# Patient Record
Sex: Female | Born: 1950 | Race: White | Hispanic: No | Marital: Married | Smoking: Never smoker
Health system: Southern US, Community
[De-identification: ages and names within clinical notes are randomized; demographics above are authoritative.]

## PROBLEM LIST (undated history)

## (undated) DIAGNOSIS — R079 Chest pain, unspecified: Secondary | ICD-10-CM

## (undated) DIAGNOSIS — N399 Disorder of urinary system, unspecified: Secondary | ICD-10-CM

## (undated) DIAGNOSIS — N3281 Overactive bladder: Secondary | ICD-10-CM

## (undated) DIAGNOSIS — M25512 Pain in left shoulder: Secondary | ICD-10-CM

## (undated) DIAGNOSIS — E782 Mixed hyperlipidemia: Secondary | ICD-10-CM

## (undated) DIAGNOSIS — M19019 Primary osteoarthritis, unspecified shoulder: Secondary | ICD-10-CM

## (undated) DIAGNOSIS — Z780 Asymptomatic menopausal state: Secondary | ICD-10-CM

## (undated) DIAGNOSIS — IMO0002 Reserved for concepts with insufficient information to code with codable children: Secondary | ICD-10-CM

## (undated) DIAGNOSIS — M722 Plantar fascial fibromatosis: Secondary | ICD-10-CM

## (undated) HISTORY — DX: Plantar fascial fibromatosis: M72.2

## (undated) HISTORY — DX: Chest pain, unspecified: R07.9

## (undated) HISTORY — DX: Asymptomatic menopausal state: Z78.0

## (undated) HISTORY — DX: Disorder of urinary system, unspecified: N39.9

## (undated) HISTORY — DX: Primary osteoarthritis, unspecified shoulder: M19.019

## (undated) HISTORY — DX: Pain in left shoulder: M25.512

## (undated) HISTORY — DX: Mixed hyperlipidemia: E78.2

## (undated) HISTORY — DX: Reserved for concepts with insufficient information to code with codable children: IMO0002

## (undated) HISTORY — DX: Overactive bladder: N32.81

---

## 2001-01-21 ENCOUNTER — Encounter: Admission: RE | Admit: 2001-01-21 | Discharge: 2001-01-21 | Payer: Self-pay | Admitting: Occupational Medicine

## 2001-01-21 ENCOUNTER — Encounter: Payer: Self-pay | Admitting: Occupational Medicine

## 2001-07-16 ENCOUNTER — Encounter: Admission: RE | Admit: 2001-07-16 | Discharge: 2001-08-12 | Payer: Self-pay | Admitting: Family Medicine

## 2001-08-18 ENCOUNTER — Encounter: Payer: Self-pay | Admitting: Family Medicine

## 2001-08-18 ENCOUNTER — Ambulatory Visit (HOSPITAL_COMMUNITY): Admission: RE | Admit: 2001-08-18 | Discharge: 2001-08-18 | Payer: Self-pay | Admitting: Family Medicine

## 2001-12-29 ENCOUNTER — Encounter: Admission: RE | Admit: 2001-12-29 | Discharge: 2002-01-20 | Payer: Self-pay | Admitting: *Deleted

## 2002-01-26 ENCOUNTER — Encounter: Admission: RE | Admit: 2002-01-26 | Discharge: 2002-03-03 | Payer: Self-pay | Admitting: Occupational Medicine

## 2003-04-26 ENCOUNTER — Other Ambulatory Visit: Admission: RE | Admit: 2003-04-26 | Discharge: 2003-04-26 | Payer: Self-pay | Admitting: Obstetrics and Gynecology

## 2016-04-07 ENCOUNTER — Emergency Department (HOSPITAL_COMMUNITY)
Admission: EM | Admit: 2016-04-07 | Discharge: 2016-04-08 | Disposition: A | Discharge status: Home / Self Care | Payer: Medicare Other | Attending: Emergency Medicine | Admitting: Emergency Medicine

## 2016-04-07 DIAGNOSIS — R600 Localized edema: Secondary | ICD-10-CM | POA: Insufficient documentation

## 2016-04-07 DIAGNOSIS — R079 Chest pain, unspecified: Secondary | ICD-10-CM

## 2016-04-08 ENCOUNTER — Encounter (HOSPITAL_COMMUNITY): Payer: Self-pay | Admitting: Emergency Medicine

## 2016-04-08 ENCOUNTER — Emergency Department (HOSPITAL_COMMUNITY): Payer: Medicare Other

## 2016-04-08 LAB — I-STAT TROPONIN, ED
TROPONIN I, POC: 0 ng/mL (ref 0.00–0.08)
TROPONIN I, POC: 0 ng/mL (ref 0.00–0.08)

## 2016-04-08 LAB — CBC
HCT: 42.9 % (ref 36.0–46.0)
Hemoglobin: 13.7 g/dL (ref 12.0–15.0)
MCH: 27.6 pg (ref 26.0–34.0)
MCHC: 31.9 g/dL (ref 30.0–36.0)
MCV: 86.5 fL (ref 78.0–100.0)
PLATELETS: 281 10*3/uL (ref 150–400)
RBC: 4.96 MIL/uL (ref 3.87–5.11)
RDW: 14.5 % (ref 11.5–15.5)
WBC: 12.7 10*3/uL — ABNORMAL HIGH (ref 4.0–10.5)

## 2016-04-08 LAB — BASIC METABOLIC PANEL
Anion gap: 7 (ref 5–15)
BUN: 13 mg/dL (ref 6–20)
CALCIUM: 9.5 mg/dL (ref 8.9–10.3)
CHLORIDE: 102 mmol/L (ref 101–111)
CO2: 28 mmol/L (ref 22–32)
CREATININE: 0.82 mg/dL (ref 0.44–1.00)
GFR calc Af Amer: >60 mL/min (ref >60)
GFR calc non Af Amer: >60 mL/min (ref >60)
GLUCOSE: 105 mg/dL — AB (ref 65–99)
Potassium: 3.9 mmol/L (ref 3.5–5.1)
Sodium: 137 mmol/L (ref 135–145)

## 2016-04-08 LAB — BRAIN NATRIURETIC PEPTIDE: B Natriuretic Peptide: 25 pg/mL (ref 0.0–100.0)

## 2016-04-08 NOTE — ED Notes (Signed)
EDP at bedside  

## 2016-04-08 NOTE — Discharge Instructions (Signed)
Nonspecific Chest Pain  °Chest pain can be caused by many different conditions. There is always a chance that your pain could be related to something serious, such as a heart attack or a blood clot in your lungs. Chest pain can also be caused by conditions that are not life-threatening. If you have chest pain, it is very important to follow up with your health care provider. °CAUSES  °Chest pain can be caused by: °· Heartburn. °· Pneumonia or bronchitis. °· Anxiety or stress. °· Inflammation around your heart (pericarditis) or lung (pleuritis or pleurisy). °· A blood clot in your lung. °· A collapsed lung (pneumothorax). It can develop suddenly on its own (spontaneous pneumothorax) or from trauma to the chest. °· Shingles infection (varicella-zoster virus). °· Heart attack. °· Damage to the bones, muscles, and cartilage that make up your chest wall. This can include: °¨ Bruised bones due to injury. °¨ Strained muscles or cartilage due to frequent or repeated coughing or overwork. °¨ Fracture to one or more ribs. °¨ Sore cartilage due to inflammation (costochondritis). °RISK FACTORS  °Risk factors for chest pain may include: °· Activities that increase your risk for trauma or injury to your chest. °· Respiratory infections or conditions that cause frequent coughing. °· Medical conditions or overeating that can cause heartburn. °· Heart disease or family history of heart disease. °· Conditions or health behaviors that increase your risk of developing a blood clot. °· Having had chicken pox (varicella zoster). °SIGNS AND SYMPTOMS °Chest pain can feel like: °· Burning or tingling on the surface of your chest or deep in your chest. °· Crushing, pressure, aching, or squeezing pain. °· Dull or sharp pain that is worse when you move, cough, or take a deep breath. °· Pain that is also felt in your back, neck, shoulder, or arm, or pain that spreads to any of these areas. °Your chest pain may come and go, or it may stay  constant. °DIAGNOSIS °Lab tests or other studies may be needed to find the cause of your pain. Your health care provider may have you take a test called an ambulatory ECG (electrocardiogram). An ECG records your heartbeat patterns at the time the test is performed. You may also have other tests, such as: °· Transthoracic echocardiogram (TTE). During echocardiography, sound waves are used to create a picture of all of the heart structures and to look at how blood flows through your heart. °· Transesophageal echocardiogram (TEE). This is a more advanced imaging test that obtains images from inside your body. It allows your health care provider to see your heart in finer detail. °· Cardiac monitoring. This allows your health care provider to monitor your heart rate and rhythm in real time. °· Holter monitor. This is a portable device that records your heartbeat and can help to diagnose abnormal heartbeats. It allows your health care provider to track your heart activity for several days, if needed. °· Stress tests. These can be done through exercise or by taking medicine that makes your heart beat more quickly. °· Blood tests. °· Imaging tests. °TREATMENT  °Your treatment depends on what is causing your chest pain. Treatment may include: °· Medicines. These may include: °¨ Acid blockers for heartburn. °¨ Anti-inflammatory medicine. °¨ Pain medicine for inflammatory conditions. °¨ Antibiotic medicine, if an infection is present. °¨ Medicines to dissolve blood clots. °¨ Medicines to treat coronary artery disease. °· Supportive care for conditions that do not require medicines. This may include: °¨ Resting. °¨ Applying heat   or cold packs to injured areas. °¨ Limiting activities until pain decreases. °HOME CARE INSTRUCTIONS °· If you were prescribed an antibiotic medicine, finish it all even if you start to feel better. °· Avoid any activities that bring on chest pain. °· Do not use any tobacco products, including  cigarettes, chewing tobacco, or electronic cigarettes. If you need help quitting, ask your health care provider. °· Do not drink alcohol. °· Take medicines only as directed by your health care provider. °· Keep all follow-up visits as directed by your health care provider. This is important. This includes any further testing if your chest pain does not go away. °· If heartburn is the cause for your chest pain, you may be told to keep your head raised (elevated) while sleeping. This reduces the chance that acid will go from your stomach into your esophagus. °· Make lifestyle changes as directed by your health care provider. These may include: °¨ Getting regular exercise. Ask your health care provider to suggest some activities that are safe for you. °¨ Eating a heart-healthy diet. A registered dietitian can help you to learn healthy eating options. °¨ Maintaining a healthy weight. °¨ Managing diabetes, if necessary. °¨ Reducing stress. °SEEK MEDICAL CARE IF: °· Your chest pain does not go away after treatment. °· You have a rash with blisters on your chest. °· You have a fever. °SEEK IMMEDIATE MEDICAL CARE IF:  °· Your chest pain is worse. °· You have an increasing cough, or you cough up blood. °· You have severe abdominal pain. °· You have severe weakness. °· You faint. °· You have chills. °· You have sudden, unexplained chest discomfort. °· You have sudden, unexplained discomfort in your arms, back, neck, or jaw. °· You have shortness of breath at any time. °· You suddenly start to sweat, or your skin gets clammy. °· You feel nauseous or you vomit. °· You suddenly feel light-headed or dizzy. °· Your heart begins to beat quickly, or it feels like it is skipping beats. °These symptoms may represent a serious problem that is an emergency. Do not wait to see if the symptoms will go away. Get medical help right away. Call your local emergency services (911 in the [place] Do not drive yourself to the hospital. °  °This  information is not intended to replace advice given to you by your health care provider. Make sure you discuss any questions you have with your health care provider. °  °Document Released: 08/14/2005 Document Revised: 11/25/2014 Document Reviewed: 06/10/2014 °Elsevier Interactive Patient Education ©2016 [employer]vier Inc[employer] ° ° °To find a primary care or specialty doctor please call [phone] or [phone] to access "[facility] Find a Doctor Service." ° °You may also go on the [facility] website at [email or web address] ° °There are also multiple [facility]  and [facility] practices throughout the [place] that are frequently accepting new patients. You may find a clinic that is close to your home and contact them. ° °[facility] -  °[address] °[place] [address] °[phone] ° °[facility] in [place] (also locations in [place] and [place]) -  °[address] °[place] [address] °[phone] ° °[facility] -  °[address] °[place] [address] °[phone] ° ° ° °

## 2016-04-08 NOTE — ED Provider Notes (Signed)
By signing my name below, I, Betty Dickerson, attest that this documentation has been prepared under the direction and in the presence of Betty Thal N Gionni Freese, DO. Electronically Signed: Doreatha MartinEva Dickerson, ED Scribe. 04/08/2016. 12:18 AM.   TIME SEEN: 12:10 AM   CHIEF COMPLAINT:  Chief Complaint  Patient presents with  . Chest Pain     HPI:  HPI Comments: Betty Dickerson is a 65 y.o. female with no significant past medical history brought in by ambulance who presents to the Emergency Department complaining of moderate, intermittent, non-radiating, substernal 3/10 chest tightness onset at 9:30PM last night while watching TV. Per pt, her symptoms have currently resolved. She reports that she ambulated, lied on her back and on her side with no relief before finding relief on the way to the ED. She states her pain was unaffected by movement or exertion. No other worsening factors noted. She was administered 2 NTG en route with complete relief of symptoms. She also took five 81 mg ASA PTA. Pt reports an episode of similar symptoms last year and had negative work up and stress test at that time. Pt is a non-smoker. No h/o heart disease, DM, hypertension, hyperlipidemia, tobacco use. She has no family history of CAD. She denies recent fever, cough, SOB, nausea, emesis, dizziness, diaphoresis. Pt sees a PA for primary care in [place] and has recently moved. Had similar symptoms one year ago was [facility] and had a stress test that she reports is negative.  ROS: See HPI Constitutional: no fever  Eyes: no drainage  ENT: no runny nose   Cardiovascular: Positive chest pain/tigthness (resolved)  Resp: no SOB  GI: no vomiting GU: no dysuria Integumentary: no rash  Allergy: no hives  Musculoskeletal: no leg swelling  Neurological: no slurred speech ROS otherwise negative  PAST MEDICAL HISTORY/PAST SURGICAL HISTORY:  History reviewed. No pertinent past medical history.  MEDICATIONS:   Prior to Admission medications   Not on File    ALLERGIES:  No Known Allergies  SOCIAL HISTORY:  Social History  Substance Use Topics  . Smoking status: Never Smoker   . Smokeless tobacco: Not on file  . Alcohol Use: Yes     Comment: occassional    FAMILY HISTORY: No family history on file.  EXAM: BP 137/87 mmHg  Pulse 80  Temp(Src) 98.8 F (37.1 C) (Oral)  Resp 19  SpO2 99% CONSTITUTIONAL: Alert and oriented and responds appropriately to questions. Well-appearing; well-nourished HEAD: Normocephalic EYES: Conjunctivae clear, PERRL ENT: normal nose; no rhinorrhea; moist mucous membranes NECK: Supple, no meningismus, no LAD  CARD: RRR; S1 and S2 appreciated; no murmurs, no clicks, no rubs, no gallops RESP: Normal chest excursion without splinting or tachypnea; breath sounds clear and equal bilaterally; no wheezes, no rhonchi, no rales, no hypoxia or respiratory distress, speaking full sentences ABD/GI: Normal bowel sounds; non-distended; soft, non-tender, no rebound, no guarding, no peritoneal signs BACK:  The back appears normal and is non-tender to palpation, there is no CVA tenderness EXT: Normal ROM in all joints; non-tender to palpation; Mild bilateral trace pitting edema in her feet and ankles that she reports is chronic and unchanged; normal capillary refill; no cyanosis, no calf tenderness or swelling    SKIN: Normal color for age and race; warm; no rash NEURO: Moves all extremities equally, sensation to light touch intact diffusely, cranial nerves II through XII intact PSYCH: The patient's mood and manner are appropriate. Grooming and personal hygiene are appropriate.  MEDICAL DECISION  MAKING: Patient here with complaints of chest tightness that resolved with nitroglycerin. No other associated symptoms. EKG shows no ischemic abnormality. We'll obtain cardiac labs, chest x-ray. Patient's heart score is 2 due to age. I feel patient can likely have serial troponins in  the emergency department and discharge if she continues to stay asymptomatic. We'll attempt to obtain records from outside hospital for stress test that she reports was 1 year ago. Doubt PE, dissection given asymptomatic currently.  ED PROGRESS: Patient has had 2 negative troponins. Her chest x-ray showed possible edema but she does not appear fluid overloaded. No shortness of breath, hypoxia. Lungs are clear and no new edema in her lower extremities. She does have some edema in her legs that she states she has had since she delivered her last child over 20 years ago. No change in the swelling and no calf tenderness. Her BNP is normal. I have low suspicion that this is actual edema. No infectious symptoms. She still reports feeling pain-free. I think she can be discharged with close outpatient follow-up. She is comfortable with this plan. She has a PCP in [place]. Will also give her PCP information hearing [facility] as well as cardiology follow-up. I feel she will need an outpatient stress test. Discussed at length return precautions. She verbalizes understanding is comfortable with this plan.   At this time, I do not feel there is any life-threatening condition present. I have reviewed and discussed all results (EKG, imaging, lab, urine as appropriate), exam findings with patient. I have reviewed nursing notes and appropriate previous records.  I feel the patient is safe to be discharged home without further emergent workup. Discussed usual and customary return precautions. Patient and family (if present) verbalize understanding and are comfortable with this plan.  Patient will follow-up with their primary care provider. If they do not have a primary care provider, information for follow-up has been provided to them. All questions have been answered.     EKG Interpretation  Date/Time:  Monday Apr 08 2016 00:10:17 EDT Ventricular Rate:  67 PR Interval:  117 QRS Duration: 97 QT Interval:  422 QTC  Calculation: 445 R Axis:   113 Text Interpretation:  Sinus rhythm Borderline short PR interval Right axis deviation No old tracing to compare Confirmed by Lama Narayanan,  DO, Rishawn Walck ([id number]) on 04/08/2016 12:18:18 AM         I personally performed the services described in this documentation, which was scribed in my presence. The recorded information has been reviewed and is accurate.   Layla Maw Author [place], DO 04/08/16 [phone]

## 2016-04-08 NOTE — ED Notes (Signed)
Per EMS pt from home with c/o chest pressure 3/10. Pt took 5 81mg  aspirin prior to EMS arrival. Given 2 nitro en route, pain decreased to 1/10. BP-123/75, HR-80, CBG-113

## 2016-06-04 ENCOUNTER — Encounter: Payer: Self-pay | Admitting: *Deleted

## 2016-06-07 ENCOUNTER — Encounter: Payer: Self-pay | Admitting: Cardiovascular Disease

## 2016-06-07 ENCOUNTER — Telehealth: Payer: Self-pay | Admitting: Cardiovascular Disease

## 2016-06-07 NOTE — Telephone Encounter (Signed)
Called pt and left message for pt to call back to update Fm and medical Hx and to get correct information concerning work number.

## 2016-06-21 ENCOUNTER — Ambulatory Visit: Payer: Self-pay | Admitting: Cardiovascular Disease

## 2016-07-04 ENCOUNTER — Encounter: Payer: Self-pay | Admitting: Cardiovascular Disease

## 2016-07-26 NOTE — Progress Notes (Signed)
Cardiology Office Note   Date:  07/29/2016   ID:  Betty Dickerson, DOB 09/05/1951, MRN [id number]  PCP:  No primary care provider on file.  Cardiologist:   Charlton HawsPeter Dhaval Woo, MD   Chief Complaint  Patient presents with  . Establish Care    had cp, no more CP in several months, per pt      History of Present Illness: Betty Dickerson is a 65 y.o. female who presents for evaluation of chest pain  Seen in ER 04/08/16 Moderate intermittent non radiating substernal 3/10 chest tightness. Started while watching TV at night. No change with exertion or position Resolved spontaneously on way to ER ? Nitro on route improved Had similar symptoms 04/2015 [facility]  with normal stress test in [place] [facility]  and recently moved here.   Has unRx hypercholesterolemia. Scared to take statins Labs from 04/2016 reviewed  TC 264 LDL 156 HDL 54  In ER no pain two negative troponins and ECG normal CXR mild vascular congestion but none On clinical exam.  And BNP normal.  D dimer not done     Past Medical History:  Diagnosis Date  . AC (acromioclavicular) arthritis   . Chest pain   . Genitourinary disorder   . Mixed hyperlipidemia   . OAB (overactive bladder)   . Plantar fasciitis of left foot   . Post-menopausal   . Shoulder pain, left     Past Surgical History:  Procedure Laterality Date  . CESAREAN SECTION       Current Outpatient Prescriptions  Medication Sig Dispense Refill  . aspirin EC 81 MG tablet Take 81 mg by mouth daily.    . Biotin 300 MCG TABS Take 300 mcg by mouth daily.    Marland Kitchen. ketoconazole (NIZORAL) 2 % cream Apply 1 application topically 2 (two) times daily.    . Pumpkin Seed-Soy Germ (AZO BLADDER CONTROL/GO-LESS PO) Take 2 tablets by mouth 2 (two) times daily.     No current facility-administered medications for this visit.     Allergies:   Review of patient's allergies indicates no known allergies.    Social History:  The patient  reports  that she has never smoked. She has never used smokeless tobacco. She reports that she drinks alcohol.   Family History:  The patient's family history includes Diabetes in her father; Osteoarthritis in her mother.    ROS:  Please see the history of present illness.   Otherwise, review of systems are positive for none.   All other systems are reviewed and negative.    PHYSICAL EXAM: VS:  BP 114/72   Pulse 82   Ht 5' 5.5" (1.664 m)   Wt 91.2 kg (201 lb 1.9 oz)   SpO2 95%   BMI 32.96 kg/m  , BMI Body mass index is 32.96 kg/m. Affect appropriate Healthy:  appears stated age HEENT: normal Neck supple with no adenopathy JVP normal no bruits no thyromegaly Lungs clear with no wheezing and good diaphragmatic motion Heart:  S1/S2 no murmur, no rub, gallop or click PMI normal Abdomen: benighn, BS positve, no tenderness, no AAA no bruit.  No HSM or HJR Distal pulses intact with no bruits No edema Neuro non-focal Skin warm and dry No muscular weakness    EKG:   04/09/16 SR rate 67 S1Q3T3 nonspecific ST changes no ST elevation/depression    Recent Labs: 04/08/2016: B Natriuretic Peptide 25.0; BUN 13; Creatinine, Ser 0.82; Hemoglobin 13.7; Platelets 281; Potassium 3.9;  Sodium 137    Lipid Panel No results found for: CHOL, TRIG, HDL, CHOLHDL, VLDL, LDLCALC, LDLDIRECT    Wt Readings from Last 3 Encounters:  07/29/16 91.2 kg (201 lb 1.9 oz)      Other studies Reviewed: Additional studies/ records that were reviewed today include: ER notes, labs, CXR ECG Notes from [facility] and stress test 2016 .    ASSESSMENT AND PLAN:  1. Chest pain atypical recurrent f/u ETT with normal ECG also recommended calcium score to further Risk stratify 2. Chol:  Calcium score to see if should be Rx. If score is above average for age and sex matched Controls would recommend 81 mg ASA and statin at least 3x/week   Current medicines are reviewed at length with the patient today.  The  patient does not have concerns regarding medicines.  The following changes have been made:  no change  Labs/ tests ordered today include: Calcium Score/ ETT  Orders Placed This Encounter  Procedures  . CT CARDIAC SCORING  . EXERCISE TOLERANCE TEST     Disposition:   FU with me in a year      Signed, Charlton Haws, MD  07/29/2016 11:13 AM    [facility] [address] [place], [place], [place]  [address] Phone: [phone]; Fax: [phone]

## 2016-07-29 ENCOUNTER — Ambulatory Visit (INDEPENDENT_AMBULATORY_CARE_PROVIDER_SITE_OTHER)
Admission: RE | Admit: 2016-07-29 | Discharge: 2016-07-29 | Disposition: A | Discharge status: Home / Self Care | Payer: Medicare Other | Source: Ambulatory Visit | Attending: Cardiovascular Disease | Admitting: Cardiovascular Disease

## 2016-07-29 ENCOUNTER — Ambulatory Visit (INDEPENDENT_AMBULATORY_CARE_PROVIDER_SITE_OTHER): Payer: Medicare Other | Admitting: Cardiovascular Disease

## 2016-07-29 ENCOUNTER — Encounter: Payer: Self-pay | Admitting: Cardiovascular Disease

## 2016-07-29 ENCOUNTER — Encounter (INDEPENDENT_AMBULATORY_CARE_PROVIDER_SITE_OTHER): Payer: Self-pay

## 2016-07-29 VITALS — BP 114/72 | HR 82 | Ht 65.5 in | Wt 201.1 lb

## 2016-07-29 DIAGNOSIS — R0789 Other chest pain: Secondary | ICD-10-CM

## 2016-07-29 DIAGNOSIS — Z7189 Other specified counseling: Secondary | ICD-10-CM

## 2016-07-29 DIAGNOSIS — Z7689 Persons encountering health services in other specified circumstances: Secondary | ICD-10-CM

## 2016-07-29 NOTE — Patient Instructions (Addendum)
Medication Instructions:  Your physician recommends that you continue on your current medications as directed. Please refer to the Current Medication list given to you today.  Labwork: NONE  Testing/Procedures: Cardiac CT Calcium Score today, (CAT scanning), is a noninvasive, special x-ray that produces cross-sectional images of the body using x-rays and a computer. CT scans help physicians diagnose and treat medical conditions. For some CT exams, a contrast material is used to enhance visibility in the area of the body being studied. CT scans provide greater clarity and reveal more details than regular x-ray exams.  Your physician has requested that you have an exercise tolerance test. For further information please visit [email or web address]. Please also follow instruction sheet, as given.  Follow-Up: Your physician wants you to follow-up in: 12 months with Dr. Eden EmmsNishan. You will receive a reminder letter in the mail two months in advance. If you don't receive a letter, please call our office to schedule the follow-up appointment.   If you need a refill on your cardiac medications before your next appointment, please call your pharmacy.

## 2016-07-30 ENCOUNTER — Ambulatory Visit: Payer: Self-pay | Admitting: Cardiovascular Disease

## 2016-08-13 ENCOUNTER — Encounter: Payer: Self-pay | Admitting: Cardiovascular Disease

## 2018-08-13 IMAGING — CT CT HEART SCORING
2 series · 16 of 20 positions shown, 18 images · non-contrast
Comparison: No priors.

CLINICAL DATA: Risk stratification

EXAM:
Coronary Calcium Score
TECHNIQUE: The patient was scanned on a Siemens Somatom 64 slice scanner. Axial
non-contrast 3mm slices were carried out through the heart. The data
set was analyzed on a dedicated work station and scored using the
Agatson method.

[Series 2: casc 3.0 i36f 2 bestdiast 73 % · axial · 0.35mm/px · z∈[-240,-162]mm · 8 of 34 slices shown, 10 images]
[im 4/34  vessel]
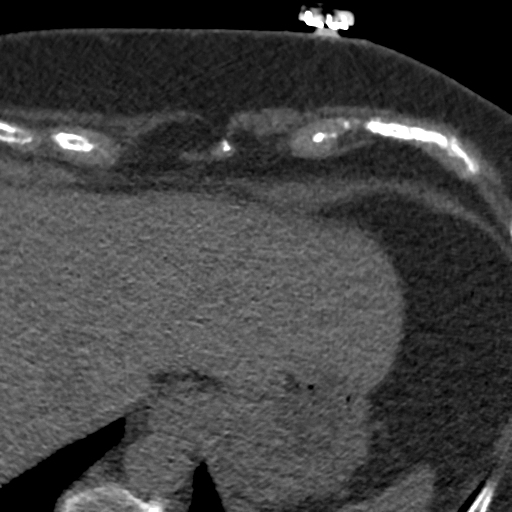
[im 4/34  lung]
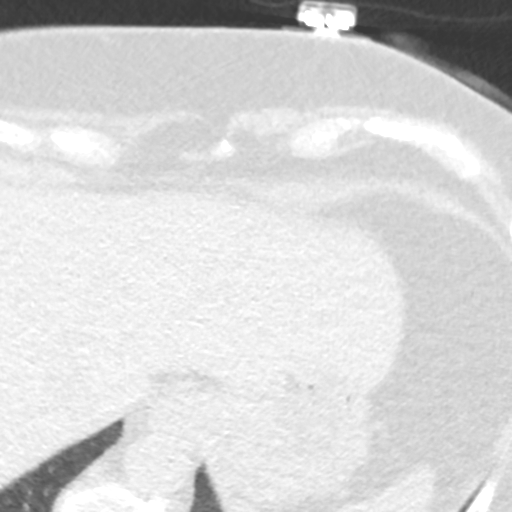
[im 8/34  vessel]
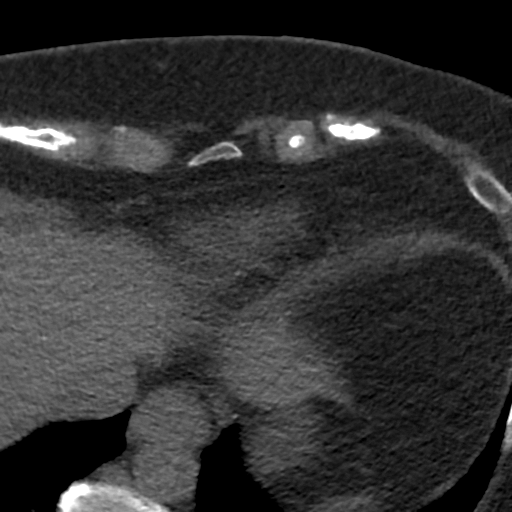
[im 12/34  vessel]
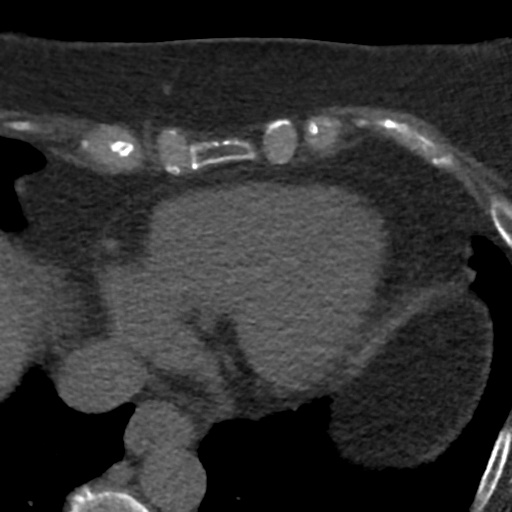
[im 15/34  vessel]
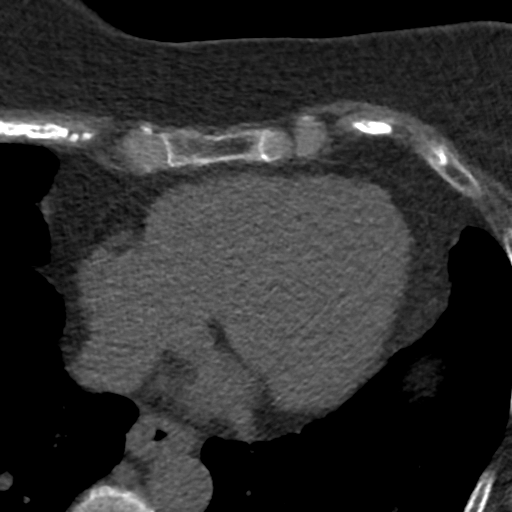
[im 19/34  vessel]
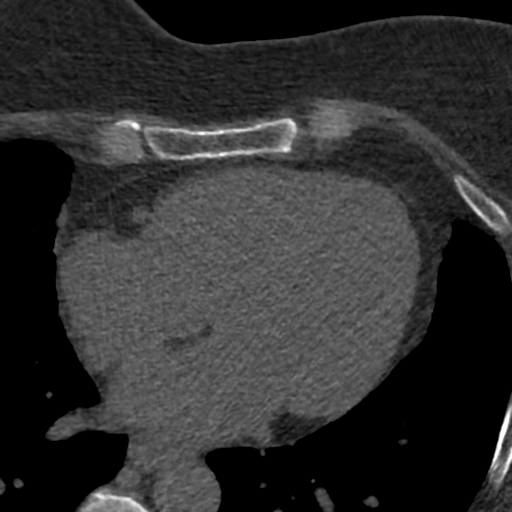
[im 19/34  lung]
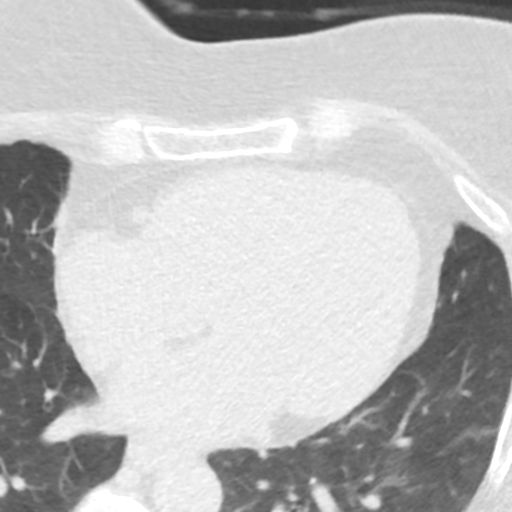
[im 23/34  vessel]
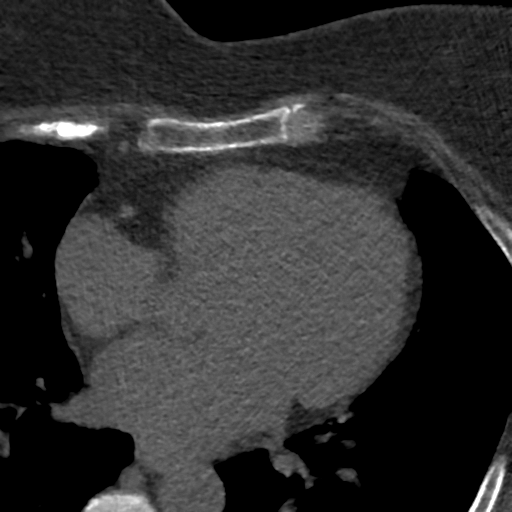
[im 26/34  vessel]
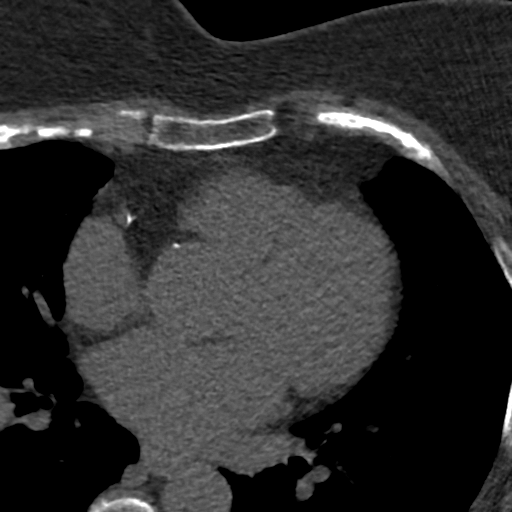
[im 30/34  vessel]
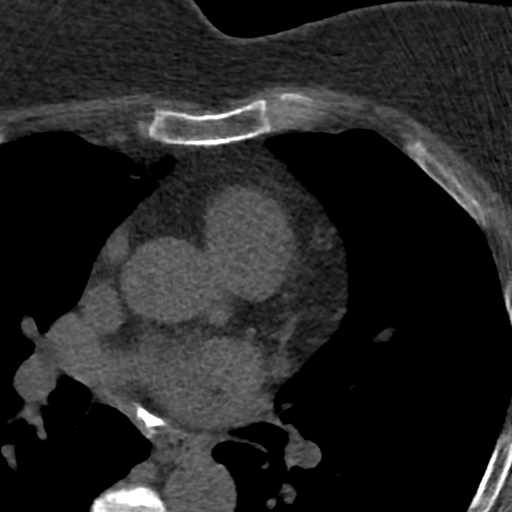

[Series 4: lung st 73 % · axial · 0.64mm/px · z∈[-240,-162]mm · 8 of 34 slices shown]
[im 4/34  lung]
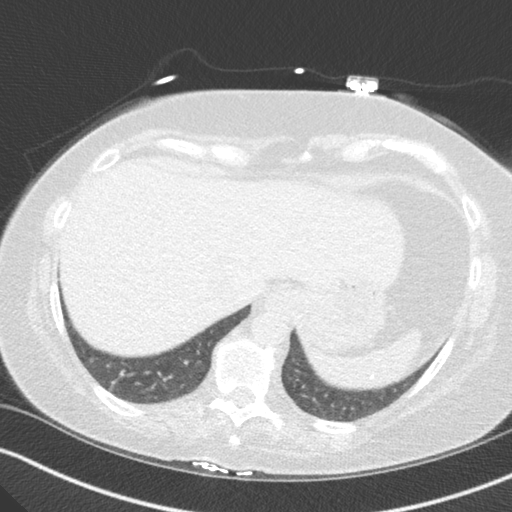
[im 8/34  lung]
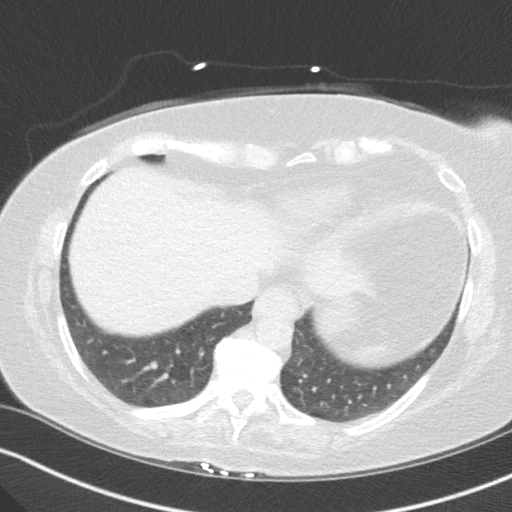
[im 12/34  lung]
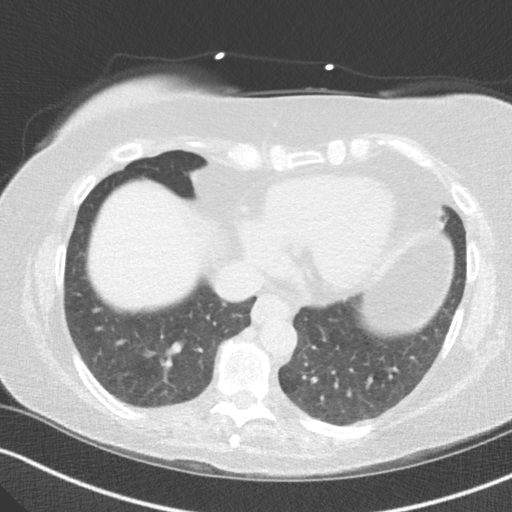
[im 15/34  lung]
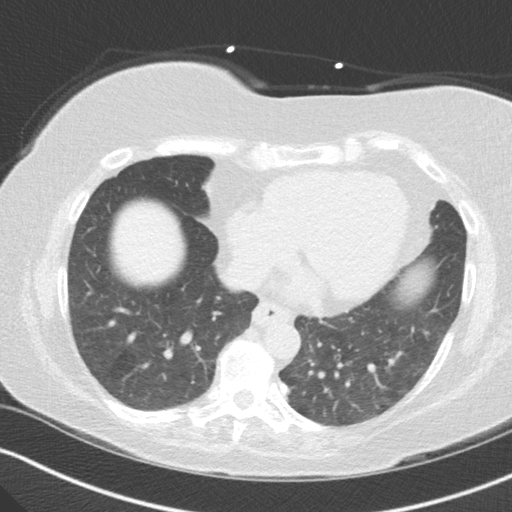
[im 19/34  lung]
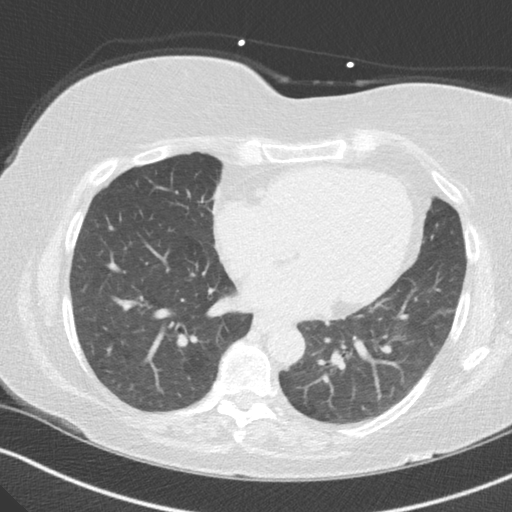
[im 23/34  lung]
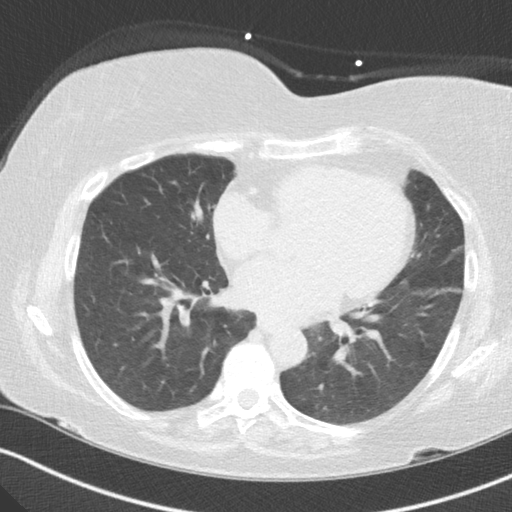
[im 26/34  lung]
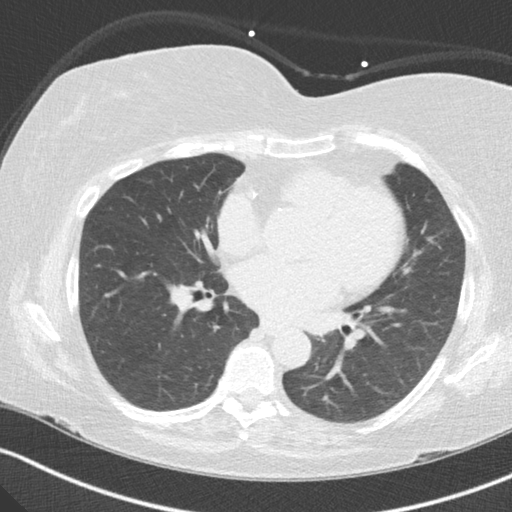
[im 30/34  lung]
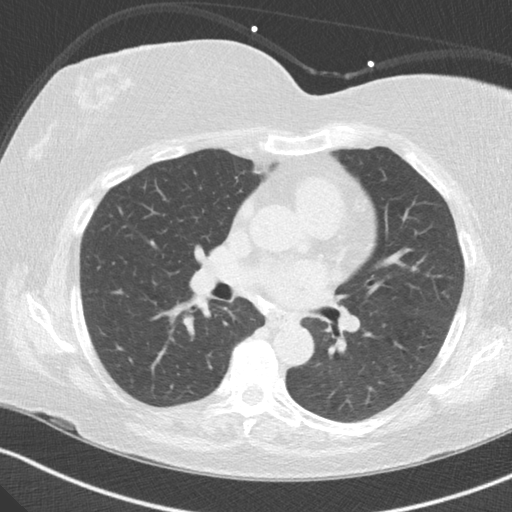

[16 of 20 positions shown; findings below may reference images not displayed]

FINDINGS: Non-cardiac: No significant non cardiac findings on limited lung and
soft tissue windows. See separate report from [REDACTED].

Ascending Aorta:  3.2 cm

Pericardium: Normal

Coronary arteries: Mild calcification seen in mid LAD and proximal
and mid RCA
IMPRESSION: Coronary calcium score of 54. This was 70th percentile for age and
sex matched control.

Porcina Soledade

EXAM:
OVER-READ INTERPRETATION  CT CHEST

The following report is an over-read performed by radiologist Dr.
over-read does not include interpretation of cardiac or coronary
anatomy or pathology. The coronary calcium score interpretation by
the cardiologist is attached.
FINDINGS: Within the visualized portions of the thorax there are no suspicious
appearing pulmonary nodules or masses, there is no acute
consolidative airspace disease, no pleural effusions, no
pneumothorax and no lymphadenopathy. Numerous densely calcified
mediastinal and left hilar lymph nodes are incidentally noted.
Visualized portions of the upper abdomen are unremarkable. There are
no aggressive appearing lytic or blastic lesions noted in the
visualized portions of the skeleton.
IMPRESSION: 1. No significant incidental noncardiac findings are noted.
